# Patient Record
Sex: Female | Born: 1990 | Race: White | Hispanic: No | Marital: Single | Smoking: Never smoker
Health system: Southern US, Community
[De-identification: ages and names within clinical notes are randomized; demographics above are authoritative.]

## PROBLEM LIST (undated history)

## (undated) DIAGNOSIS — J45909 Unspecified asthma, uncomplicated: Secondary | ICD-10-CM

---
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
## 2012-12-11 ENCOUNTER — Encounter (HOSPITAL_COMMUNITY): Payer: Self-pay

## 2012-12-11 ENCOUNTER — Emergency Department (HOSPITAL_COMMUNITY)
Admission: EM | Admit: 2012-12-11 | Discharge: 2012-12-11 | Disposition: A | Discharge status: Home / Self Care | Payer: BC Managed Care – PPO | Attending: Emergency Medicine | Admitting: Emergency Medicine

## 2012-12-11 DIAGNOSIS — J45909 Unspecified asthma, uncomplicated: Secondary | ICD-10-CM | POA: Insufficient documentation

## 2012-12-11 DIAGNOSIS — Y92410 Unspecified street and highway as the place of occurrence of the external cause: Secondary | ICD-10-CM | POA: Insufficient documentation

## 2012-12-11 DIAGNOSIS — S39012A Strain of muscle, fascia and tendon of lower back, initial encounter: Secondary | ICD-10-CM

## 2012-12-11 DIAGNOSIS — S139XXA Sprain of joints and ligaments of unspecified parts of neck, initial encounter: Secondary | ICD-10-CM | POA: Insufficient documentation

## 2012-12-11 DIAGNOSIS — S161XXA Strain of muscle, fascia and tendon at neck level, initial encounter: Secondary | ICD-10-CM

## 2012-12-11 DIAGNOSIS — Y9389 Activity, other specified: Secondary | ICD-10-CM | POA: Insufficient documentation

## 2012-12-11 DIAGNOSIS — S0990XA Unspecified injury of head, initial encounter: Secondary | ICD-10-CM | POA: Insufficient documentation

## 2012-12-11 DIAGNOSIS — R42 Dizziness and giddiness: Secondary | ICD-10-CM | POA: Insufficient documentation

## 2012-12-11 DIAGNOSIS — S239XXA Sprain of unspecified parts of thorax, initial encounter: Secondary | ICD-10-CM | POA: Insufficient documentation

## 2012-12-11 HISTORY — DX: Unspecified asthma, uncomplicated: J45.909

## 2012-12-11 MED ORDER — CYCLOBENZAPRINE HCL 10 MG PO TABS: 10.0000 mg | ORAL_TABLET | Freq: Two times a day (BID) | ORAL | Status: AC | PRN
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
## 2012-12-18 NOTE — ED Provider Notes (Signed)
Medical screening examination/treatment/procedure(s) were performed by non-physician practitioner and as supervising physician I was immediately available for consultation/collaboration.  Derwood Kaplan, MD 12/18/12 1836

## 2014-06-21 ENCOUNTER — Encounter: Payer: Self-pay | Admitting: Gynecology

## 2014-06-21 ENCOUNTER — Other Ambulatory Visit (HOSPITAL_COMMUNITY)
Admission: RE | Admit: 2014-06-21 | Discharge: 2014-06-21 | Disposition: A | Discharge status: Home / Self Care | Payer: 59 | Source: Ambulatory Visit | Attending: Gynecology | Admitting: Gynecology

## 2014-06-21 ENCOUNTER — Ambulatory Visit (INDEPENDENT_AMBULATORY_CARE_PROVIDER_SITE_OTHER): Payer: 59 | Admitting: Gynecology

## 2014-06-21 VITALS — BP 122/84 | Ht 71.0 in | Wt 180.0 lb

## 2014-06-21 DIAGNOSIS — Z01419 Encounter for gynecological examination (general) (routine) without abnormal findings: Secondary | ICD-10-CM

## 2014-06-21 DIAGNOSIS — Z23 Encounter for immunization: Secondary | ICD-10-CM

## 2014-06-21 DIAGNOSIS — Z113 Encounter for screening for infections with a predominantly sexual mode of transmission: Secondary | ICD-10-CM

## 2014-06-21 DIAGNOSIS — Z01411 Encounter for gynecological examination (general) (routine) with abnormal findings: Secondary | ICD-10-CM | POA: Diagnosis present

## 2014-06-21 NOTE — Patient Instructions (Addendum)
HPV Vaccine Gardasil (Human Papillomavirus): What You Need to Know 1. What is HPV? Genital human papillomavirus (HPV) is the most common sexually transmitted virus in the [place]. More than half of sexually active men and women are infected with HPV at some time in their lives. About 20 million Americans are currently infected, and about 6 million more get infected each year. HPV is usually spread through sexual contact. Most HPV infections don't cause any symptoms, and go away on their own. But HPV can cause cervical cancer in women. Cervical cancer is the 2nd leading cause of cancer deaths among women around the world. In the [place], about 12,000 women get cervical cancer every year and about 4,000 are expected to die from it. HPV is also associated with several less common cancers, such as vaginal and vulvar cancers in women, and anal and oropharyngeal (back of the throat, including base of tongue and tonsils) cancers in both men and women. HPV can also cause genital warts and warts in the throat. There is no cure for HPV infection, but some of the problems it causes can be treated. 2. HPV vaccine: Why get vaccinated? The HPV vaccine you are getting is one of two vaccines that can be given to prevent HPV. It may be given to both males and females.  This vaccine can prevent most cases of cervical cancer in females, if it is given before exposure to the virus. In addition, it can prevent vaginal and vulvar cancer in females, and genital warts and anal cancer in both males and females. Protection from HPV vaccine is expected to be long-lasting. But vaccination is not a substitute for cervical cancer screening. Women should still get regular Pap tests. 3. Who should get this HPV vaccine and when? HPV vaccine is given as a 3-dose series  1st Dose: Now  2nd Dose: 1 to 2 months after Dose 1  3rd Dose: 6 months after Dose 1 Additional (booster) doses are not recommended. Routine  vaccination  This HPV vaccine is recommended for girls and boys 11 or 21 years of age. It may be given starting at age 9. Why is HPV vaccine recommended at 11 or 21 years of age?  HPV infection is easily acquired, even with only one sex partner. That is why it is important to get HPV vaccine before any sexual contact takes place. Also, response to the vaccine is better at this age than at older ages. Catch-up vaccination This vaccine is recommended for the following people who have not completed the 3-dose series:   Females 13 through 21 years of age.  Males 13 through 21 years of age. This vaccine may be given to men 22 through 21 years of age who have not completed the 3-dose series. It is recommended for men through age 26 who have sex with men or whose immune system is weakened because of HIV infection, other illness, or medications.  HPV vaccine may be given at the same time as other vaccines. 4. Some people should not get HPV vaccine or should wait.  Anyone who has ever had a life-threatening allergic reaction to any component of HPV vaccine, or to a previous dose of HPV vaccine, should not get the vaccine. Tell your doctor if the person getting vaccinated has any severe allergies, including an allergy to yeast.  HPV vaccine is not recommended for pregnant women. However, receiving HPV vaccine when pregnant is not a reason to consider terminating the pregnancy. Women who are breast   feeding may get the vaccine.  People who are mildly ill when a dose of HPV is planned can still be vaccinated. People with a moderate or severe illness should wait until they are better. 5. What are the risks from this vaccine? This HPV vaccine has been used in the [place] and around the world for about six years and has been very safe. However, any medicine could possibly cause a serious problem, such as a severe allergic reaction. The risk of any vaccine causing a serious injury, or death, is extremely  small. Life-threatening allergic reactions from vaccines are very rare. If they do occur, it would be within a few minutes to a few hours after the vaccination. Several mild to moderate problems are known to occur with this HPV vaccine. These do not last long and go away on their own.  Reactions in the arm where the shot was given:  Pain (about 8 people in 10)  Redness or swelling (about 1 person in 4)  Fever:  Mild (100 F) (about 1 person in 10)  Moderate (102 F) (about 1 person in 3)  Other problems:  Headache (about 1 person in 3)  Fainting: Brief fainting spells and related symptoms (such as jerking movements) can happen after any medical procedure, including vaccination. Sitting or lying down for about 15 minutes after a vaccination can help prevent fainting and injuries caused by falls. Tell your doctor if the patient feels dizzy or light-headed, or has vision changes or ringing in the ears.  Like all vaccines, HPV vaccines will continue to be monitored for unusual or severe problems. 6. What if there is a serious reaction? What should I look for?  Look for anything that concerns you, such as signs of a severe allergic reaction, very high fever, or behavior changes. Signs of a severe allergic reaction can include hives, swelling of the face and throat, difficulty breathing, a fast heartbeat, dizziness, and weakness. These would start a few minutes to a few hours after the vaccination.  What should I do?  If you think it is a severe allergic reaction or other emergency that can't wait, call 9-1-1 or get the person to the nearest hospital. Otherwise, call your doctor.  Afterward, the reaction should be reported to the Vaccine Adverse Event Reporting System (VAERS). Your doctor might file this report, or you can do it yourself through the VAERS web site at www.vaers.SamedayNews.es, or by calling 212-027-3630. VAERS is only for reporting reactions. They do not give medical  advice. 7. The National Vaccine Injury Compensation Program  The [employer] Vaccine Injury Compensation Program (VICP) is a federal program that was created to compensate people who may have been injured by certain vaccines.  Persons who believe they may have been injured by a vaccine can learn about the program and about filing a claim by calling (408)353-3954 or visiting the [employer] website at GoldCloset.com.ee. 8. How can I learn more?  Ask your doctor.  Call your local or state health department.  Contact the Centers for Disease Control and Prevention (CDC):  Call (651)606-7303 (1-800-CDC-INFO)  or  Visit CDC's website at http://hunter.com/ CDC Human Papillomavirus (HPV) Gardasil (Interim) 01/24/12 Document Released: 06/23/2006 Document Revised: 01/10/2014 Document Reviewed: 10/07/2013 ExitCare Patient Information 2015 [place], [place]. This information is not intended to replace advice given to you by your health care provider. Make sure you discuss any questions you have with your health care provider. Breast Self-Awareness Practicing breast self-awareness may pick up problems early, prevent significant  medical complications, and possibly save your life. By practicing breast self-awareness, you can become familiar with how your breasts look and feel and if your breasts are changing. This allows you to notice changes early. It can also offer you some reassurance that your breast health is good. One way to learn what is normal for your breasts and whether your breasts are changing is to do a breast self-exam. If you find a lump or something that was not present in the past, it is best to contact your caregiver right away. Other findings that should be evaluated by your caregiver include nipple discharge, especially if it is bloody; skin changes or reddening; areas where the skin seems to be pulled in (retracted); or new lumps and bumps. Breast pain is seldom associated with  cancer (malignancy), but should also be evaluated by a caregiver. HOW TO PERFORM A BREAST SELF-EXAM The best time to examine your breasts is 5-7 days after your menstrual period is over. During menstruation, the breasts are lumpier, and it may be more difficult to pick up changes. If you do not menstruate, have reached menopause, or had your uterus removed (hysterectomy), you should examine your breasts at regular intervals, such as monthly. If you are breastfeeding, examine your breasts after a feeding or after using a breast pump. Breast implants do not decrease the risk for lumps or tumors, so continue to perform breast self-exams as recommended. Talk to your caregiver about how to determine the difference between the implant and breast tissue. Also, talk about the amount of pressure you should use during the exam. Over time, you will become more familiar with the variations of your breasts and more comfortable with the exam. A breast self-exam requires you to remove all your clothes above the waist. Look at your breasts and nipples. Stand in front of a mirror in a room with good lighting. With your hands on your hips, push your hands firmly downward. Look for a difference in shape, contour, and size from one breast to the other (asymmetry). Asymmetry includes puckers, dips, or bumps. Also, look for skin changes, such as reddened or scaly areas on the breasts. Look for nipple changes, such as discharge, dimpling, repositioning, or redness. Carefully feel your breasts. This is best done either in the shower or tub while using soapy water or when flat on your back. Place the arm (on the side of the breast you are examining) above your head. Use the pads (not the fingertips) of your three middle fingers on your opposite hand to feel your breasts. Start in the underarm area and use  inch (2 cm) overlapping circles to feel your breast. Use 3 different levels of pressure (light, medium, and firm pressure) at each  circle before moving to the next circle. The light pressure is needed to feel the tissue closest to the skin. The medium pressure will help to feel breast tissue a little deeper, while the firm pressure is needed to feel the tissue close to the ribs. Continue the overlapping circles, moving downward over the breast until you feel your ribs below your breast. Then, move one finger-width towards the center of the body. Continue to use the  inch (2 cm) overlapping circles to feel your breast as you move slowly up toward the collar bone (clavicle) near the base of the neck. Continue the up and down exam using all 3 pressures until you reach the middle of the chest. Do this with each breast, carefully feeling for lumps  or changes.  Keep a written record with breast changes or normal findings for each breast. By writing this information down, you do not need to depend only on memory for size, tenderness, or location. Write down where you are in your menstrual cycle, if you are still menstruating. Breast tissue can have some lumps or thick tissue. However, see your caregiver if you find anything that concerns you.  SEEK MEDICAL CARE IF: You see a change in shape, contour, or size of your breasts or nipples.  You see skin changes, such as reddened or scaly areas on the breasts or nipples.  You have an unusual discharge from your nipples.  You feel a new lump or unusually thick areas.  Document Released: 08/26/2005 Document Revised: 08/12/2012 Document Reviewed: 12/11/2011 [facility] Patient Information 2015 [place]. This information is not intended to replace advice given to you by your health care provider. Make sure you discuss any questions you have with your health care provider. Influenza Virus Vaccine (Flucelvax) What is this medicine? INFLUENZA VIRUS VACCINE (in floo EN zuh VAHY ruhs vak SEEN) helps to reduce the risk of getting influenza also known as the flu. The vaccine only helps protect  you against some strains of the flu. This medicine may be used for other purposes; ask your health care provider or pharmacist if you have questions. COMMON BRAND NAME(S): FLUCELVAX What should I tell my health care provider before I take this medicine? They need to know if you have any of these conditions: -bleeding disorder like hemophilia -fever or infection -Guillain-Barre syndrome or other neurological problems -immune system problems -infection with the human immunodeficiency virus (HIV) or AIDS -low blood platelet counts -multiple sclerosis -an unusual or allergic reaction to influenza virus vaccine, other medicines, foods, dyes or preservatives -pregnant or trying to get pregnant -breast-feeding How should I use this medicine? This vaccine is for injection into a muscle. It is given by a health care professional. A copy of Vaccine Information Statements will be given before each vaccination. Read this sheet carefully each time. The sheet may change frequently. Talk to your pediatrician regarding the use of this medicine in children. Special care may be needed. Overdosage: If you think you've taken too much of this medicine contact a poison control center or emergency room at once. Overdosage: If you think you have taken too much of this medicine contact a poison control center or emergency room at once. NOTE: This medicine is only for you. Do not share this medicine with others. What if I miss a dose? This does not apply. What may interact with this medicine? -chemotherapy or radiation therapy -medicines that lower your immune system like etanercept, anakinra, infliximab, and adalimumab -medicines that treat or prevent blood clots like warfarin -phenytoin -steroid medicines like prednisone or cortisone -theophylline -vaccines This list may not describe all possible interactions. Give your health care provider a list of all the medicines, herbs, non-prescription drugs, or  dietary supplements you use. Also tell them if you smoke, drink alcohol, or use illegal drugs. Some items may interact with your medicine. What should I watch for while using this medicine? Report any side effects that do not go away within 3 days to your doctor or health care professional. Call your health care provider if any unusual symptoms occur within 6 weeks of receiving this vaccine. You may still catch the flu, but the illness is not usually as bad. You cannot get the flu from the vaccine. The vaccine will not  protect against colds or other illnesses that may cause fever. The vaccine is needed every year. What side effects may I notice from receiving this medicine? Side effects that you should report to your doctor or health care professional as soon as possible: -allergic reactions like skin rash, itching or hives, swelling of the face, lips, or tongue Side effects that usually do not require medical attention (Report these to your doctor or health care professional if they continue or are bothersome.): -fever -headache -muscle aches and pains -pain, tenderness, redness, or swelling at the injection site -tiredness This list may not describe all possible side effects. Call your doctor for medical advice about side effects. You may report side effects to FDA at 1-800-FDA-1088. Where should I keep my medicine? The vaccine will be given by a health care professional in a clinic, pharmacy, doctor's office, or other health care setting. You will not be given vaccine doses to store at home. NOTE: This sheet is a summary. It may not cover all possible information. If you have questions about this medicine, talk to your doctor, pharmacist, or health care provider.  2015, [employer]. (2011-08-07 14:06:47)

## 2014-06-21 NOTE — Addendum Note (Signed)
Addended by: Kem ParkinsonBARNES, Rosalie Gelpi on: 06/21/2014 09:47 AM   Modules accepted: Orders

## 2014-06-21 NOTE — Progress Notes (Signed)
    Cassie HoyleKatie M Wolaver 12-Jul-1991 [id number]   History:    23 y.o.  for annual gyn exam who is new to the patient. The patient is sexually active. She is currently on Junel 1/20 Fe oral contraceptive pill is having normal menstrual cycles. She is also using condoms as well. She has never had a Pap smear gynecological exam in the past. Patient with no health issues. Patient currently on third day her menstrual cycle  Past medical history,surgical history, family history and social history were all reviewed and documented in the EPIC chart.  Gynecologic History Patient's last menstrual period was 06/21/2014. Contraception: OCP (estrogen/progesterone) Last Pap: No prior study. Results were: No prior study Last mammogram: Not indicated. Results were: Not indicated  Obstetric History OB History  No data available     ROS: A ROS was performed and pertinent positives and negatives are included in the history.  GENERAL: No fevers or chills. HEENT: No change in vision, no earache, sore throat or sinus congestion. NECK: No pain or stiffness. CARDIOVASCULAR: No chest pain or pressure. No palpitations. PULMONARY: No shortness of breath, cough or wheeze. GASTROINTESTINAL: No abdominal pain, nausea, vomiting or diarrhea, melena or bright red blood per rectum. GENITOURINARY: No urinary frequency, urgency, hesitancy or dysuria. MUSCULOSKELETAL: No joint or muscle pain, no back pain, no recent trauma. DERMATOLOGIC: No rash, no itching, no lesions. ENDOCRINE: No polyuria, polydipsia, no heat or cold intolerance. No recent change in weight. HEMATOLOGICAL: No anemia or easy bruising or bleeding. NEUROLOGIC: No headache, seizures, numbness, tingling or weakness. PSYCHIATRIC: No depression, no loss of interest in normal activity or change in sleep pattern.     Exam: chaperone present  BP 122/84  Ht 5\' 11"  (1.803 m)  Wt 180 lb (81.647 kg)  BMI 25.12 kg/m2  LMP 06/21/2014  Body mass index is 25.12  kg/(m^2).  General appearance : Well developed well nourished female. No acute distress HEENT: Neck supple, trachea midline, no carotid bruits, no thyroidmegaly Lungs: Clear to auscultation, no rhonchi or wheezes, or rib retractions  Heart: Regular rate and rhythm, no murmurs or gallops Breast:Examined in sitting and supine position were symmetrical in appearance, no palpable masses or tenderness,  no skin retraction, no nipple inversion, no nipple discharge, no skin discoloration, no axillary or supraclavicular lymphadenopathy Abdomen: no palpable masses or tenderness, no rebound or guarding Extremities: no edema or skin discoloration or tenderness  Pelvic:  Bartholin, Urethra, Skene Glands: Within normal limits             Vagina: No gross lesions or discharge, menstrual blood  Cervix: No gross lesions or discharge  Uterus  anteverted, normal size, shape and consistency, non-tender and mobile  Adnexa  Without masses or tenderness  Anus and perineum  normal   Rectovaginal  normal sphincter tone without palpated masses or tenderness             Hemoccult not indicated     Assessment/Plan:  23 y.o. female for annual exam with no abnormalities detected. Patient will continue on oral contraceptive pill and condoms. The patient received the flu vaccine today. Patient was given information and will return to the office at the end of the week to receive the HPV vaccine. The following labs were ordered: CBC, cut his metabolic panel, fasting lipid profile, and urinalysis. Pap smear was done today. Instructions on self breast exam was provided as well.   Ok EdwardsFERNANDEZ,JUAN H MD, 9:09 AM 06/21/2014

## 2014-06-22 ENCOUNTER — Other Ambulatory Visit: Payer: Self-pay

## 2014-06-22 LAB — URINALYSIS W MICROSCOPIC + REFLEX CULTURE
Bilirubin Urine: NEGATIVE
Casts: NONE SEEN
Crystals: NONE SEEN
GLUCOSE, UA: NEGATIVE mg/dL
KETONES UR: NEGATIVE mg/dL
Leukocytes, UA: NEGATIVE
Nitrite: NEGATIVE
PROTEIN: NEGATIVE mg/dL
Specific Gravity, Urine: 1.026 (ref 1.005–1.030)
Urobilinogen, UA: 0.2 mg/dL (ref 0.0–1.0)
pH: 5 (ref 5.0–8.0)

## 2014-06-22 LAB — CYTOLOGY - PAP

## 2014-06-23 LAB — GC/CHLAMYDIA PROBE AMP
CT Probe RNA: NEGATIVE
GC Probe RNA: NEGATIVE

## 2014-06-23 LAB — URINE CULTURE
Colony Count: NO GROWTH
Organism ID, Bacteria: NO GROWTH

## 2014-06-24 ENCOUNTER — Ambulatory Visit (INDEPENDENT_AMBULATORY_CARE_PROVIDER_SITE_OTHER): Payer: 59 | Admitting: *Deleted

## 2014-06-24 DIAGNOSIS — Z23 Encounter for immunization: Secondary | ICD-10-CM

## 2014-08-09 ENCOUNTER — Telehealth: Payer: Self-pay | Admitting: Anesthesiology

## 2014-08-09 NOTE — Telephone Encounter (Signed)
Left message on patient's voicemail to return to the office to have labs drawn. She forgot to stop by the lab on her last office visit.

## 2014-09-16 ENCOUNTER — Other Ambulatory Visit (INDEPENDENT_AMBULATORY_CARE_PROVIDER_SITE_OTHER): Payer: 59 | Admitting: Anesthesiology

## 2014-09-16 DIAGNOSIS — Z23 Encounter for immunization: Secondary | ICD-10-CM

## 2015-06-27 ENCOUNTER — Ambulatory Visit (INDEPENDENT_AMBULATORY_CARE_PROVIDER_SITE_OTHER): Payer: 59 | Admitting: Gynecology

## 2015-06-27 ENCOUNTER — Encounter: Payer: Self-pay | Admitting: Gynecology

## 2015-06-27 VITALS — BP 110/70 | Ht 71.0 in | Wt 176.0 lb

## 2015-06-27 DIAGNOSIS — Z113 Encounter for screening for infections with a predominantly sexual mode of transmission: Secondary | ICD-10-CM

## 2015-06-27 DIAGNOSIS — Z23 Encounter for immunization: Secondary | ICD-10-CM

## 2015-06-27 DIAGNOSIS — Z01419 Encounter for gynecological examination (general) (routine) without abnormal findings: Secondary | ICD-10-CM | POA: Diagnosis not present

## 2015-06-27 LAB — CBC WITH DIFFERENTIAL/PLATELET
Basophils Absolute: 0 10*3/uL (ref 0.0–0.1)
Basophils Relative: 0 % (ref 0–1)
EOS PCT: 2 % (ref 0–5)
Eosinophils Absolute: 0.1 10*3/uL (ref 0.0–0.7)
HCT: 37.2 % (ref 36.0–46.0)
Hemoglobin: 12.8 g/dL (ref 12.0–15.0)
LYMPHS PCT: 38 % (ref 12–46)
Lymphs Abs: 2.7 10*3/uL (ref 0.7–4.0)
MCH: 31.4 pg (ref 26.0–34.0)
MCHC: 34.4 g/dL (ref 30.0–36.0)
MCV: 91.2 fL (ref 78.0–100.0)
MPV: 9.2 fL (ref 8.6–12.4)
Monocytes Absolute: 0.6 10*3/uL (ref 0.1–1.0)
Monocytes Relative: 8 % (ref 3–12)
Neutro Abs: 3.7 10*3/uL (ref 1.7–7.7)
Neutrophils Relative %: 52 % (ref 43–77)
PLATELETS: 281 10*3/uL (ref 150–400)
RBC: 4.08 MIL/uL (ref 3.87–5.11)
RDW: 12.6 % (ref 11.5–15.5)
WBC: 7.2 10*3/uL (ref 4.0–10.5)

## 2015-06-27 LAB — CHOLESTEROL, TOTAL: Cholesterol: 179 mg/dL (ref 125–200)

## 2015-06-27 NOTE — Progress Notes (Signed)
    Cassie Wheeler 1991/08/18 [id number]   History:    24 y.o.  for annual gyn exam with no complaints today. Patient having normal menstrual cycle on Junel 1/20 oral contraceptive pill. Patient has completed the HPV virus vaccine series. Last year was her first Pap smear which was normal. Patient requesting flu vaccine today.  Past medical history,surgical history, family history and social history were all reviewed and documented in the EPIC chart.  Gynecologic History Patient's last menstrual period was 06/16/2015. Contraception: OCP (estrogen/progesterone) Last Pap: 2015. Results were: normal Last mammogram: Not indicated. Results were: Not indicated  Obstetric History OB History  Gravida Para Term Preterm AB SAB TAB Ectopic Multiple Living  0 0 0 0 0 0 0 0 0 0          ROS: A ROS was performed and pertinent positives and negatives are included in the history.  GENERAL: No fevers or chills. HEENT: No change in vision, no earache, sore throat or sinus congestion. NECK: No pain or stiffness. CARDIOVASCULAR: No chest pain or pressure. No palpitations. PULMONARY: No shortness of breath, cough or wheeze. GASTROINTESTINAL: No abdominal pain, nausea, vomiting or diarrhea, melena or bright red blood per rectum. GENITOURINARY: No urinary frequency, urgency, hesitancy or dysuria. MUSCULOSKELETAL: No joint or muscle pain, no back pain, no recent trauma. DERMATOLOGIC: No rash, no itching, no lesions. ENDOCRINE: No polyuria, polydipsia, no heat or cold intolerance. No recent change in weight. HEMATOLOGICAL: No anemia or easy bruising or bleeding. NEUROLOGIC: No headache, seizures, numbness, tingling or weakness. PSYCHIATRIC: No depression, no loss of interest in normal activity or change in sleep pattern.     Exam: chaperone present  BP 110/70 mmHg  Ht 5\' 11"  (1.803 m)  Wt 176 lb (79.833 kg)  BMI 24.56 kg/m2  LMP 06/16/2015  Body mass index is 24.56 kg/(m^2).  General appearance : Well  developed well nourished female. No acute distress HEENT: Eyes: no retinal hemorrhage or exudates,  Neck supple, trachea midline, no carotid bruits, no thyroidmegaly Lungs: Clear to auscultation, no rhonchi or wheezes, or rib retractions  Heart: Regular rate and rhythm, no murmurs or gallops Breast:Examined in sitting and supine position were symmetrical in appearance, no palpable masses or tenderness,  no skin retraction, no nipple inversion, no nipple discharge, no skin discoloration, no axillary or supraclavicular lymphadenopathy Abdomen: no palpable masses or tenderness, no rebound or guarding Extremities: no edema or skin discoloration or tenderness  Pelvic:  Bartholin, Urethra, Skene Glands: Within normal limits             Vagina: No gross lesions or discharge  Cervix: No gross lesions or discharge  Uterus  anteverted, normal size, shape and consistency, non-tender and mobile  Adnexa  Without masses or tenderness  Anus and perineum  normal   Rectovaginal  normal sphincter tone without palpated masses or tenderness             Hemoccult not indicated     Assessment/Plan:  24 y.o. female for annual exam having normal menstrual cycles and doing well on oral contraceptive pill. A CBC, screening cholesterol and urinalysis as well as for STD screening GC and Chlamydia culture will be obtained today. Patient received the flu vaccine today. Follow up one year or when necessary.   Ok EdwardsFERNANDEZ,JUAN H MD, 2:29 PM 06/27/2015

## 2015-06-28 LAB — URINALYSIS W MICROSCOPIC + REFLEX CULTURE
BILIRUBIN URINE: NEGATIVE
Bacteria, UA: NONE SEEN [HPF]
Casts: NONE SEEN [LPF]
GLUCOSE, UA: NEGATIVE
HGB URINE DIPSTICK: NEGATIVE
KETONES UR: NEGATIVE
LEUKOCYTES UA: NEGATIVE
NITRITE: NEGATIVE
PH: 5.5 (ref 5.0–8.0)
Protein, ur: NEGATIVE
RBC / HPF: NONE SEEN RBC/HPF (ref <=2)
SPECIFIC GRAVITY, URINE: 1.025 (ref 1.001–1.035)
WBC UA: NONE SEEN WBC/HPF (ref <=5)
YEAST: NONE SEEN [HPF]

## 2015-06-28 LAB — GC/CHLAMYDIA PROBE AMP
CT Probe RNA: NEGATIVE
GC Probe RNA: NEGATIVE

## 2016-06-06 ENCOUNTER — Other Ambulatory Visit (HOSPITAL_COMMUNITY)
Admission: RE | Admit: 2016-06-06 | Discharge: 2016-06-06 | Disposition: A | Discharge status: Home / Self Care | Payer: BLUE CROSS/BLUE SHIELD | Source: Ambulatory Visit | Attending: Nurse Practitioner | Admitting: Nurse Practitioner

## 2016-06-06 ENCOUNTER — Other Ambulatory Visit: Payer: Self-pay | Admitting: Nurse Practitioner

## 2016-06-06 DIAGNOSIS — Z113 Encounter for screening for infections with a predominantly sexual mode of transmission: Secondary | ICD-10-CM | POA: Insufficient documentation

## 2016-06-06 DIAGNOSIS — Z01419 Encounter for gynecological examination (general) (routine) without abnormal findings: Secondary | ICD-10-CM | POA: Diagnosis not present

## 2016-06-07 LAB — CYTOLOGY - PAP

## 2016-06-12 ENCOUNTER — Encounter: Payer: Self-pay | Admitting: Family Medicine

## 2016-06-12 ENCOUNTER — Ambulatory Visit (INDEPENDENT_AMBULATORY_CARE_PROVIDER_SITE_OTHER): Payer: BLUE CROSS/BLUE SHIELD | Admitting: Family Medicine

## 2016-06-12 VITALS — BP 114/72 | HR 57 | Temp 98.1°F | Resp 17 | Ht 71.0 in | Wt 172.0 lb

## 2016-06-12 DIAGNOSIS — Z114 Encounter for screening for human immunodeficiency virus [HIV]: Secondary | ICD-10-CM | POA: Diagnosis not present

## 2016-06-12 DIAGNOSIS — Z13220 Encounter for screening for lipoid disorders: Secondary | ICD-10-CM

## 2016-06-12 DIAGNOSIS — Z91013 Allergy to seafood: Secondary | ICD-10-CM | POA: Diagnosis not present

## 2016-06-12 DIAGNOSIS — Z131 Encounter for screening for diabetes mellitus: Secondary | ICD-10-CM | POA: Diagnosis not present

## 2016-06-12 DIAGNOSIS — Z113 Encounter for screening for infections with a predominantly sexual mode of transmission: Secondary | ICD-10-CM

## 2016-06-12 DIAGNOSIS — Z0000 Encounter for general adult medical examination without abnormal findings: Secondary | ICD-10-CM | POA: Diagnosis not present

## 2016-06-12 DIAGNOSIS — Z23 Encounter for immunization: Secondary | ICD-10-CM | POA: Diagnosis not present

## 2016-06-12 LAB — COMPREHENSIVE METABOLIC PANEL
ALBUMIN: 4.3 g/dL (ref 3.6–5.1)
ALK PHOS: 56 U/L (ref 33–115)
ALT: 26 U/L (ref 6–29)
AST: 22 U/L (ref 10–30)
BUN: 11 mg/dL (ref 7–25)
CALCIUM: 9.9 mg/dL (ref 8.6–10.2)
CHLORIDE: 103 mmol/L (ref 98–110)
CO2: 26 mmol/L (ref 20–31)
Creat: 0.82 mg/dL (ref 0.50–1.10)
Glucose, Bld: 85 mg/dL (ref 65–99)
POTASSIUM: 3.8 mmol/L (ref 3.5–5.3)
Sodium: 138 mmol/L (ref 135–146)
TOTAL PROTEIN: 7 g/dL (ref 6.1–8.1)
Total Bilirubin: 0.5 mg/dL (ref 0.2–1.2)

## 2016-06-12 LAB — CBC WITH DIFFERENTIAL/PLATELET
BASOS ABS: 0 {cells}/uL (ref 0–200)
Basophils Relative: 0 %
EOS ABS: 195 {cells}/uL (ref 15–500)
Eosinophils Relative: 3 %
HEMATOCRIT: 40.3 % (ref 35.0–45.0)
Hemoglobin: 13.4 g/dL (ref 11.7–15.5)
LYMPHS PCT: 46 %
Lymphs Abs: 2990 cells/uL (ref 850–3900)
MCH: 30.5 pg (ref 27.0–33.0)
MCHC: 33.3 g/dL (ref 32.0–36.0)
MCV: 91.6 fL (ref 80.0–100.0)
MONO ABS: 390 {cells}/uL (ref 200–950)
MONOS PCT: 6 %
MPV: 8.5 fL (ref 7.5–12.5)
NEUTROS ABS: 2925 {cells}/uL (ref 1500–7800)
Neutrophils Relative %: 45 %
PLATELETS: 314 10*3/uL (ref 140–400)
RBC: 4.4 MIL/uL (ref 3.80–5.10)
RDW: 12.2 % (ref 11.0–15.0)
WBC: 6.5 10*3/uL (ref 3.8–10.8)

## 2016-06-12 LAB — LIPID PANEL
CHOLESTEROL: 195 mg/dL (ref 125–200)
HDL: 64 mg/dL (ref >=46)
LDL CALC: 113 mg/dL (ref <130)
TRIGLYCERIDES: 88 mg/dL (ref <150)
Total CHOL/HDL Ratio: 3 Ratio (ref <=5.0)
VLDL: 18 mg/dL (ref <30)

## 2016-06-12 LAB — POCT URINALYSIS DIP (MANUAL ENTRY)
BILIRUBIN UA: NEGATIVE
BILIRUBIN UA: NEGATIVE
GLUCOSE UA: NEGATIVE
Leukocytes, UA: NEGATIVE
Nitrite, UA: NEGATIVE
PH UA: 6
Protein Ur, POC: NEGATIVE
Spec Grav, UA: 1.02
Urobilinogen, UA: 0.2

## 2016-06-12 MED ORDER — EPINEPHRINE 0.3 MG/0.3ML IJ SOAJ: 0.3000 mg | Freq: Once | INTRAMUSCULAR | 1 refills | Status: AC

## 2016-06-12 NOTE — Progress Notes (Signed)
Subjective:    Patient ID: Cassie Wheeler, female    DOB: 02/15/1991, 25 y.o.   MRN: [id number]  06/12/2016  Annual Exam   HPI This 25 y.o. female presents for Complete Physical Examination.  Last physical:  5+ uears Pap smear:  06-06-2016 [facility]. WNL; regular menses; OCP; light short. TDAP:  Not sure Gardisil: 09/2014; 06/2014 at gynecologist Influenza: 06/06/16 Eye exam:  Never in years; no contacts or glasses. Dental exam:  Every six months; wisdom teeth extraction.  OCP for three years. [facility] on OCP.  Long term relationship.    Review of Systems  Constitutional: Negative for activity change, appetite change, chills, diaphoresis, fatigue, fever and unexpected weight change.  HENT: Negative for congestion, dental problem, drooling, ear discharge, ear pain, facial swelling, hearing loss, mouth sores, nosebleeds, postnasal drip, rhinorrhea, sinus pressure, sneezing, sore throat, tinnitus, trouble swallowing and voice change.   Eyes: Negative for photophobia, pain, discharge, redness, itching and visual disturbance.  Respiratory: Negative for apnea, cough, choking, chest tightness, shortness of breath, wheezing and stridor.   Cardiovascular: Negative for chest pain, palpitations and leg swelling.  Gastrointestinal: Negative for abdominal distention, abdominal pain, anal bleeding, blood in stool, constipation, diarrhea, nausea, rectal pain and vomiting.  Endocrine: Negative for cold intolerance, heat intolerance, polydipsia, polyphagia and polyuria.  Genitourinary: Negative for decreased urine volume, difficulty urinating, dyspareunia, dysuria, enuresis, flank pain, frequency, genital sores, hematuria, menstrual problem, pelvic pain, urgency, vaginal bleeding, vaginal discharge and vaginal pain.  Musculoskeletal: Negative for arthralgias, back pain, gait problem, joint swelling, myalgias, neck pain and neck stiffness.  Skin: Negative for color change, pallor, rash and wound.    Allergic/Immunologic: Negative for environmental allergies, food allergies and immunocompromised state.  Neurological: Negative for dizziness, tremors, seizures, syncope, facial asymmetry, speech difficulty, weakness, light-headedness, numbness and headaches.  Hematological: Negative for adenopathy. Does not bruise/bleed easily.  Psychiatric/Behavioral: Negative for agitation, behavioral problems, confusion, decreased concentration, dysphoric mood, hallucinations, self-injury, sleep disturbance and suicidal ideas. The patient is not nervous/anxious and is not hyperactive.     Past Medical History:  Diagnosis Date  . Asthma    exercise induced   No past surgical history on file. Allergies  Allergen Reactions  . Shellfish Allergy Anaphylaxis   Current Outpatient Prescriptions  Medication Sig Dispense Refill  . acetaminophen (TYLENOL) 500 MG tablet Take 500 mg by mouth every 6 (six) hours as needed for pain.    . ferrous sulfate 325 (65 FE) MG tablet Take 325 mg by mouth daily with breakfast.    . JUNEL FE 1/20 1-20 MG-MCG tablet Take 1 tablet by mouth daily.    . Multiple Vitamin (MULTIVITAMIN WITH MINERALS) TABS Take 1 tablet by mouth daily.    . cyclobenzaprine (FLEXERIL) 10 MG tablet Take 1 tablet (10 mg total) by mouth 2 (two) times daily as needed for muscle spasms. (Patient not taking: Reported on 06/12/2016) 20 tablet 0   No current facility-administered medications for this visit.    Social History   Social History  . Marital status: Single    Spouse name: N/A  . Number of children: N/A  . Years of education: N/A   Occupational History  . Not on file.   Social History Main Topics  . Smoking status: Never Smoker  . Smokeless tobacco: Not on file  . Alcohol use 0.0 oz/week     Comment: 1-2 every other week  . Drug use: No  . Sexual activity: Yes    Birth control/ protection:  Pill     Comment: 1st intercourse- 21, partners- 3   Other Topics Concern  . Not on file    Social History Narrative   Marital status: single; dating seriously x 3 years; happy      Children: none      Lives: with mom; moving to [place] in 2017 [place], [place].      Employment: unemployment in 06/2016      Education: [employer]; graduated in May 2017      Tobacco: none      Alcohol: socially; 1-2 times per week      Drugs: none      Exercise: jogging daily; 2 miles jogging daily; weight lifting      Seatbelt: 100%; no texting      Sexual activity:  Yes; total partners = 3; no STDs.   Family History  Problem Relation Age of Onset  . Parkinson's disease Father   . Mental illness Father   . ADD / ADHD Brother   . Anxiety disorder Brother        Objective:    BP 114/72 (BP Location: Right Arm, Patient Position: Sitting, Cuff Size: Large)   Pulse (!) 57   Temp 98.1 F (36.7 C) (Oral)   Resp 17   Ht 5' 11"  (1.803 m)   Wt 172 lb (78 kg)   LMP 05/29/2016   SpO2 99%   BMI 23.99 kg/m  Physical Exam  Constitutional: She is oriented to person, place, and time. She appears well-developed and well-nourished. No distress.  HENT:  Head: Normocephalic and atraumatic.  Right Ear: External ear normal.  Left Ear: External ear normal.  Nose: Nose normal.  Mouth/Throat: Oropharynx is clear and moist.  Eyes: Conjunctivae and EOM are normal. Pupils are equal, round, and reactive to light.  Neck: Normal range of motion and full passive range of motion without pain. Neck supple. No JVD present. Carotid bruit is not present. No thyromegaly present.  Cardiovascular: Normal rate, regular rhythm and normal heart sounds.  Exam reveals no gallop and no friction rub.   No murmur heard. Pulmonary/Chest: Effort normal and breath sounds normal. She has no wheezes. She has no rales.  Abdominal: Soft. Bowel sounds are normal. She exhibits no distension and no mass. There is no tenderness. There is no rebound and no guarding.  Musculoskeletal:       Right shoulder:  Normal.       Left shoulder: Normal.       Cervical back: Normal.  Lymphadenopathy:    She has no cervical adenopathy.  Neurological: She is alert and oriented to person, place, and time. She has normal reflexes. No cranial nerve deficit. She exhibits normal muscle tone. Coordination normal.  Skin: Skin is warm and dry. No rash noted. She is not diaphoretic. No erythema. No pallor.  Psychiatric: She has a normal mood and affect. Her behavior is normal. Judgment and thought content normal.  Nursing note and vitals reviewed.  Results for orders placed or performed in visit on 06/12/16  CBC with Differential/Platelet  Result Value Ref Range   WBC 6.5 3.8 - 10.8 K/uL   RBC 4.40 3.80 - 5.10 MIL/uL   Hemoglobin 13.4 11.7 - 15.5 g/dL   HCT 40.3 35.0 - 45.0 %   MCV 91.6 80.0 - 100.0 fL   MCH 30.5 27.0 - 33.0 pg   MCHC 33.3 32.0 - 36.0 g/dL   RDW 12.2 11.0 - 15.0 %   Platelets  314 140 - 400 K/uL   MPV 8.5 7.5 - 12.5 fL   Neutro Abs 2,925 1,500 - 7,800 cells/uL   Lymphs Abs 2,990 850 - 3,900 cells/uL   Monocytes Absolute 390 200 - 950 cells/uL   Eosinophils Absolute 195 15 - 500 cells/uL   Basophils Absolute 0 0 - 200 cells/uL   Neutrophils Relative % 45 %   Lymphocytes Relative 46 %   Monocytes Relative 6 %   Eosinophils Relative 3 %   Basophils Relative 0 %   Smear Review Criteria for review not met   Comprehensive metabolic panel  Result Value Ref Range   Sodium 138 135 - 146 mmol/L   Potassium 3.8 3.5 - 5.3 mmol/L   Chloride 103 98 - 110 mmol/L   CO2 26 20 - 31 mmol/L   Glucose, Bld 85 65 - 99 mg/dL   BUN 11 7 - 25 mg/dL   Creat 0.82 0.50 - 1.10 mg/dL   Total Bilirubin 0.5 0.2 - 1.2 mg/dL   Alkaline Phosphatase 56 33 - 115 U/L   AST 22 10 - 30 U/L   ALT 26 6 - 29 U/L   Total Protein 7.0 6.1 - 8.1 g/dL   Albumin 4.3 3.6 - 5.1 g/dL   Calcium 9.9 8.6 - 10.2 mg/dL  Lipid panel  Result Value Ref Range   Cholesterol 195 125 - 200 mg/dL   Triglycerides 88 <150 mg/dL   HDL 64  >=46 mg/dL   Total CHOL/HDL Ratio 3.0 <=5.0 Ratio   VLDL 18 <30 mg/dL   LDL Cholesterol 113 <130 mg/dL  HIV antibody  Result Value Ref Range   HIV 1&2 Ab, 4th Generation NONREACTIVE NONREACTIVE  Hemoglobin A1c  Result Value Ref Range   Hgb A1c MFr Bld 5.0 <5.7 %   Mean Plasma Glucose 97 mg/dL  RPR  Result Value Ref Range   RPR Ser Ql NON REAC NON REAC  POCT urinalysis dipstick  Result Value Ref Range   Color, UA yellow yellow   Clarity, UA clear clear   Glucose, UA negative negative   Bilirubin, UA negative negative   Ketones, POC UA negative negative   Spec Grav, UA 1.020    Blood, UA trace-intact (A) negative   pH, UA 6.0    Protein Ur, POC negative negative   Urobilinogen, UA 0.2    Nitrite, UA Negative Negative   Leukocytes, UA Negative Negative       Assessment & Plan:   1. Routine physical examination   2. Screening for diabetes mellitus   3. Screening, lipid   4. Screening for HIV (human immunodeficiency virus)   5. Screening examination for STD (sexually transmitted disease)   6. Need for meningococcal vaccination   7. Need for prophylactic vaccination and inoculation against viral hepatitis   8. Shellfish allergy    -anticipatory guidance provided. -pap smear UTD. -obtain age appropriate screening labs. -s/p meningococcal, Hepatitis A vaccines.  RTC six months for Hepatitis A#2.  Gardisil actually UTD.   Orders Placed This Encounter  Procedures  . Hepatitis A vaccine adult IM  . Meningococcal conjugate vaccine 4-valent IM  . CBC with Differential/Platelet  . Comprehensive metabolic panel    Order Specific Question:   Has the patient fasted?    Answer:   Yes  . Lipid panel    Order Specific Question:   Has the patient fasted?    Answer:   Yes  . HIV antibody  . Hemoglobin A1c  .  RPR  . POCT urinalysis dipstick   Meds ordered this encounter  Medications  . EPINEPHrine (EPIPEN 2-PAK) 0.3 mg/0.3 mL IJ SOAJ injection    Sig: Inject 0.3 mLs (0.3  mg total) into the muscle once.    Dispense:  1 Device    Refill:  1    No Follow-up on file.   Owynn Mosqueda Elayne Guerin, M.D. [facility] [address] [place], [place]  [address] [phone] phone [phone] fax

## 2016-06-12 NOTE — Patient Instructions (Addendum)
   IF you received an x-ray today, you will receive an invoice from [facility]. Please contact [facility] at [phone] with questions or concerns regarding your invoice.   IF you received labwork today, you will receive an invoice from [facility]. Please contact [facility] at [phone] with questions or concerns regarding your invoice.   Our billing staff will not be able to assist you with questions regarding bills from these companies.  You will be contacted with the lab results as soon as they are available. The fastest way to get your results is to activate your My Chart account. Instructions are located on the last page of this paperwork. If you have not heard from us regarding the results in 2 weeks, please contact this office.     Keeping You Healthy  Get These Tests 1. Blood Pressure- Have your blood pressure checked once a year by your health care provider.  Normal blood pressure is 120/80. 2. Weight- Have your body mass index (BMI) calculated to screen for obesity.  BMI is measure of body fat based on height and weight.  You can also calculate your own BMI at www.nhlbisupport.com/bmi/. 3. Cholesterol- Have your cholesterol checked every 5 years starting at age 20 then yearly starting at age 45. 4. Chlamydia, HIV, and other sexually transmitted diseases- Get screened every year until age 25, then within three months of each new sexual provider. 5. Pap Test - Every 1-5 years; discuss with your health care provider. 6. Mammogram- Every 1-2 years starting at age 40--50  Take these medicines  Calcium with Vitamin D-Your body needs 1200 mg of Calcium each day and 800-1000 IU of Vitamin D daily.  Your body can only absorb 500 mg of Calcium at a time so Calcium must be taken in 2 or 3 divided doses throughout the day.  Multivitamin with folic acid- Once daily if it is possible for you to become pregnant.  Get these  Immunizations  Gardasil-Series of three doses; prevents HPV related illness such as genital warts and cervical cancer.  Menactra-Single dose; prevents meningitis.  Tetanus shot- Every 10 years.  Flu shot-Every year.  Take these steps 1. Do not smoke-Your healthcare provider can help you quit.  For tips on how to quit go to www.smokefree.gov or call 1-800 QUITNOW. 2. Be physically active- Exercise 5 days a week for at least 30 minutes.  If you are not already physically active, start slow and gradually work up to 30 minutes of moderate physical activity.  Examples of moderate activity include walking briskly, dancing, swimming, bicycling, etc. 3. Breast Cancer- A self breast exam every month is important for early detection of breast cancer.  For more information and instruction on self breast exams, ask your healthcare provider or www.womenshealth.gov/faq/breast-self-exam.cfm. 4. Eat a healthy diet- Eat a variety of healthy foods such as fruits, vegetables, whole grains, low fat milk, low fat cheeses, yogurt, lean meats, poultry and fish, beans, nuts, tofu, etc.  For more information go to www. Thenutritionsource.org 5. Drink alcohol in moderation- Limit alcohol intake to one drink or less per day. Never drink and drive. 6. Depression- Your emotional health is as important as your physical health.  If you're feeling down or losing interest in things you normally enjoy please talk to your healthcare provider about being screened for depression. 7. Dental visit- Brush and floss your teeth twice daily; visit your dentist twice a year. 8. Eye doctor- Get an eye exam at least every   2 years. 9. Helmet use- Always wear a helmet when riding a bicycle, motorcycle, rollerblading or skateboarding. 10. Safe sex- If you may be exposed to sexually transmitted infections, use a condom. 11. Seat belts- Seat belts can save your live; always wear one. 12. Smoke/Carbon Monoxide detectors- These detectors need to  be installed on the appropriate level of your home. Replace batteries at least once a year. 13. Skin cancer- When out in the sun please cover up and use sunscreen 15 SPF or higher. 14. Violence- If anyone is threatening or hurting you, please tell your healthcare provider.        

## 2016-06-13 LAB — HIV ANTIBODY (ROUTINE TESTING W REFLEX): HIV: NONREACTIVE

## 2016-06-13 LAB — HEMOGLOBIN A1C
Hgb A1c MFr Bld: 5 % (ref <5.7)
MEAN PLASMA GLUCOSE: 97 mg/dL

## 2016-06-13 LAB — RPR

## 2016-06-27 ENCOUNTER — Encounter: Payer: BLUE CROSS/BLUE SHIELD | Admitting: Gynecology

## 2016-06-27 DIAGNOSIS — Z0289 Encounter for other administrative examinations: Secondary | ICD-10-CM

## 2016-06-30 DIAGNOSIS — Z91013 Allergy to seafood: Secondary | ICD-10-CM | POA: Insufficient documentation

## 2016-09-27 ENCOUNTER — Encounter: Payer: Self-pay | Admitting: Family Medicine

## 2017-01-22 ENCOUNTER — Encounter: Payer: Self-pay | Admitting: Gynecology

## 2018-01-23 ENCOUNTER — Encounter: Payer: Self-pay | Admitting: Family Medicine

## 2018-01-28 ENCOUNTER — Encounter: Payer: Self-pay | Admitting: Family Medicine
